# Patient Record
Sex: Male | Born: 1980 | Race: Black or African American | Hispanic: No | Marital: Single | Smoking: Current every day smoker
Health system: Southern US, Community
[De-identification: ages and names within clinical notes are randomized; demographics above are authoritative.]

## PROBLEM LIST (undated history)

## (undated) ENCOUNTER — Emergency Department: Admission: EM | Discharge status: Absent without leave | Payer: Medicaid Other | Source: Home / Self Care

## (undated) DIAGNOSIS — K219 Gastro-esophageal reflux disease without esophagitis: Secondary | ICD-10-CM

---
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
## 2017-10-11 ENCOUNTER — Encounter: Payer: Self-pay | Admitting: Emergency Medicine

## 2017-10-11 ENCOUNTER — Emergency Department

## 2017-10-11 ENCOUNTER — Emergency Department
Admission: EM | Admit: 2017-10-11 | Discharge: 2017-10-11 | Attending: Emergency Medicine | Admitting: Emergency Medicine

## 2017-10-11 ENCOUNTER — Other Ambulatory Visit: Payer: Self-pay

## 2017-10-11 DIAGNOSIS — S0285XA Fracture of orbit, unspecified, initial encounter for closed fracture: Secondary | ICD-10-CM

## 2017-10-11 DIAGNOSIS — Y999 Unspecified external cause status: Secondary | ICD-10-CM | POA: Diagnosis not present

## 2017-10-11 DIAGNOSIS — S0280XA Fracture of other specified skull and facial bones, unspecified side, initial encounter for closed fracture: Secondary | ICD-10-CM | POA: Insufficient documentation

## 2017-10-11 DIAGNOSIS — S0511XA Contusion of eyeball and orbital tissues, right eye, initial encounter: Secondary | ICD-10-CM | POA: Diagnosis not present

## 2017-10-11 DIAGNOSIS — Y939 Activity, unspecified: Secondary | ICD-10-CM | POA: Insufficient documentation

## 2017-10-11 DIAGNOSIS — Y92148 Other place in prison as the place of occurrence of the external cause: Secondary | ICD-10-CM | POA: Diagnosis not present

## 2017-10-11 DIAGNOSIS — S0990XA Unspecified injury of head, initial encounter: Secondary | ICD-10-CM | POA: Diagnosis present

## 2017-10-11 DIAGNOSIS — R51 Headache: Secondary | ICD-10-CM | POA: Diagnosis not present

## 2017-10-11 DIAGNOSIS — H1131 Conjunctival hemorrhage, right eye: Secondary | ICD-10-CM | POA: Diagnosis not present

## 2017-10-11 MED ORDER — AMOXICILLIN-POT CLAVULANATE 875-125 MG PO TABS: 1.0000 | ORAL_TABLET | Freq: Two times a day (BID) | ORAL | 0 refills | Status: AC

## 2017-10-11 MED ORDER — IBUPROFEN 800 MG PO TABS
800.0000 mg | ORAL_TABLET | ORAL | Status: AC
  Administered 2017-10-11: 800 mg via ORAL
  Filled 2017-10-11: qty 1
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
## 2017-10-22 ENCOUNTER — Other Ambulatory Visit: Payer: Self-pay

## 2017-10-22 ENCOUNTER — Encounter: Payer: Self-pay | Admitting: *Deleted

## 2017-10-23 ENCOUNTER — Ambulatory Visit
Admission: RE | Admit: 2017-10-23 | Discharge: 2017-10-23 | Disposition: A | Discharge status: Home / Self Care | Payer: Self-pay | Source: Ambulatory Visit | Attending: Otolaryngology | Admitting: Otolaryngology

## 2017-10-23 ENCOUNTER — Ambulatory Visit: Payer: Self-pay | Admitting: Anesthesiology

## 2017-10-23 ENCOUNTER — Encounter
Admission: RE | Disposition: A | Discharge status: Home / Self Care | Payer: Self-pay | Source: Ambulatory Visit | Attending: Otolaryngology

## 2017-10-23 DIAGNOSIS — S0231XA Fracture of orbital floor, right side, initial encounter for closed fracture: Secondary | ICD-10-CM | POA: Insufficient documentation

## 2017-10-23 DIAGNOSIS — F17200 Nicotine dependence, unspecified, uncomplicated: Secondary | ICD-10-CM | POA: Insufficient documentation

## 2017-10-23 DIAGNOSIS — Z833 Family history of diabetes mellitus: Secondary | ICD-10-CM | POA: Insufficient documentation

## 2017-10-23 DIAGNOSIS — X58XXXA Exposure to other specified factors, initial encounter: Secondary | ICD-10-CM | POA: Insufficient documentation

## 2017-10-23 DIAGNOSIS — Y939 Activity, unspecified: Secondary | ICD-10-CM | POA: Insufficient documentation

## 2017-10-23 DIAGNOSIS — K219 Gastro-esophageal reflux disease without esophagitis: Secondary | ICD-10-CM | POA: Insufficient documentation

## 2017-10-23 DIAGNOSIS — S0571XA Avulsion of right eye, initial encounter: Secondary | ICD-10-CM | POA: Insufficient documentation

## 2017-10-23 HISTORY — DX: Gastro-esophageal reflux disease without esophagitis: K21.9

## 2017-10-23 SURGERY — NASAL VALVE REPAIR WITH NASAL IMPLANT
Anesthesia: General | Site: Eye | Laterality: Right | Wound class: Clean

## 2017-10-23 MED ORDER — MIDAZOLAM HCL 5 MG/5ML IJ SOLN
INTRAMUSCULAR | Status: DC | PRN
  Administered 2017-10-23: 2 mg via INTRAVENOUS

## 2017-10-23 MED ORDER — OXYCODONE HCL 5 MG/5ML PO SOLN: 5.0000 mg | Freq: Once | ORAL | Status: DC | PRN

## 2017-10-23 MED ORDER — ACETAMINOPHEN 10 MG/ML IV SOLN: 1000.0000 mg | Freq: Once | INTRAVENOUS | Status: DC | PRN

## 2017-10-23 MED ORDER — EPHEDRINE SULFATE 50 MG/ML IJ SOLN
INTRAMUSCULAR | Status: DC | PRN
  Administered 2017-10-23: 5 mg via INTRAVENOUS
  Administered 2017-10-23: 10 mg via INTRAVENOUS
  Administered 2017-10-23: 5 mg via INTRAVENOUS
  Administered 2017-10-23: 10 mg via INTRAVENOUS

## 2017-10-23 MED ORDER — SODIUM CHLORIDE 0.9 % IV SOLN
2000.0000 mg | Freq: Once | INTRAVENOUS | Status: AC
  Administered 2017-10-23: 2000 mg via INTRAVENOUS

## 2017-10-23 MED ORDER — DEXAMETHASONE SODIUM PHOSPHATE 4 MG/ML IJ SOLN
INTRAMUSCULAR | Status: DC | PRN
  Administered 2017-10-23: 8 mg via INTRAVENOUS

## 2017-10-23 MED ORDER — OXYCODONE HCL 5 MG PO TABS: 5.0000 mg | ORAL_TABLET | Freq: Once | ORAL | Status: DC | PRN

## 2017-10-23 MED ORDER — LACTATED RINGERS IV SOLN
INTRAVENOUS | Status: DC
  Administered 2017-10-23 (×2): via INTRAVENOUS

## 2017-10-23 MED ORDER — ONDANSETRON HCL 4 MG/2ML IJ SOLN: 4.0000 mg | Freq: Once | INTRAMUSCULAR | Status: DC | PRN

## 2017-10-23 MED ORDER — FENTANYL CITRATE (PF) 100 MCG/2ML IJ SOLN
INTRAMUSCULAR | Status: DC | PRN
  Administered 2017-10-23: 100 ug via INTRAVENOUS
  Administered 2017-10-23: 25 ug via INTRAVENOUS

## 2017-10-23 MED ORDER — ONDANSETRON HCL 4 MG/2ML IJ SOLN
INTRAMUSCULAR | Status: DC | PRN
  Administered 2017-10-23 (×2): 4 mg via INTRAVENOUS

## 2017-10-23 MED ORDER — PHENYLEPHRINE HCL 0.5 % NA SOLN
NASAL | Status: DC | PRN
  Administered 2017-10-23: 2 [drp] via NASAL

## 2017-10-23 MED ORDER — SUCCINYLCHOLINE CHLORIDE 20 MG/ML IJ SOLN
INTRAMUSCULAR | Status: DC | PRN
  Administered 2017-10-23: 100 mg via INTRAVENOUS

## 2017-10-23 MED ORDER — LIDOCAINE-EPINEPHRINE (PF) 1 %-1:200000 IJ SOLN
INTRAMUSCULAR | Status: DC | PRN
  Administered 2017-10-23: 2 mL

## 2017-10-23 MED ORDER — LIDOCAINE HCL (CARDIAC) 20 MG/ML IV SOLN
INTRAVENOUS | Status: DC | PRN
  Administered 2017-10-23: 40 mg via INTRAVENOUS

## 2017-10-23 MED ORDER — BACITRACIN 500 UNIT/GM OP OINT
TOPICAL_OINTMENT | OPHTHALMIC | Status: DC | PRN
  Administered 2017-10-23: 1 via OPHTHALMIC

## 2017-10-23 MED ORDER — ACETAMINOPHEN 10 MG/ML IV SOLN
1000.0000 mg | Freq: Once | INTRAVENOUS | Status: AC
  Administered 2017-10-23: 1000 mg via INTRAVENOUS

## 2017-10-23 MED ORDER — LACTATED RINGERS IV SOLN: INTRAVENOUS | Status: DC

## 2017-10-23 MED ORDER — PROPOFOL 10 MG/ML IV BOLUS
INTRAVENOUS | Status: DC | PRN
  Administered 2017-10-23: 300 mg via INTRAVENOUS

## 2017-10-23 MED ORDER — FENTANYL CITRATE (PF) 100 MCG/2ML IJ SOLN: 25.0000 ug | INTRAMUSCULAR | Status: DC | PRN

## 2017-10-23 MED ORDER — DEXMEDETOMIDINE HCL 200 MCG/2ML IV SOLN
INTRAVENOUS | Status: DC | PRN
  Administered 2017-10-23: 12 ug via INTRAVENOUS

## 2017-10-23 SURGICAL SUPPLY — 39 items
ADHESIVE MASTISOL STRL (MISCELLANEOUS) IMPLANT
APPLICATOR COTTON TIP WD 3 STR (MISCELLANEOUS) ×6 IMPLANT
BLADE SURG 15 STRL LF DISP TIS (BLADE) IMPLANT
BLADE SURG 15 STRL SS (BLADE)
CANISTER SUCT 1200ML W/VALVE (MISCELLANEOUS) ×3 IMPLANT
CLOSURE WOUND 1/2 X4 (GAUZE/BANDAGES/DRESSINGS) ×1
CORD BIP STRL DISP 12FT (MISCELLANEOUS) ×3 IMPLANT
DRSG TELFA 3X8 NADH (GAUZE/BANDAGES/DRESSINGS) IMPLANT
ELECT CAUTERY NEEDLE 2.0 MIC (NEEDLE) ×3 IMPLANT
ELECT REM PT RETURN 9FT ADLT (ELECTROSURGICAL) ×3
ELECTRODE REM PT RTRN 9FT ADLT (ELECTROSURGICAL) ×1 IMPLANT
GLOVE SURG SS PI 7.5 STRL IVOR (GLOVE) ×9 IMPLANT
GOWN STRL REUS W/ TWL LRG LVL3 (GOWN DISPOSABLE) ×1 IMPLANT
GOWN STRL REUS W/TWL LRG LVL3 (GOWN DISPOSABLE) ×2
KIT TURNOVER KIT A (KITS) ×3 IMPLANT
MARKER SKIN DUAL TIP RULER LAB (MISCELLANEOUS) ×3 IMPLANT
NS IRRIG 500ML POUR BTL (IV SOLUTION) ×3 IMPLANT
PACK HEAD/NECK (MISCELLANEOUS) ×3 IMPLANT
PATTIES SURGICAL .5 X.5 (GAUZE/BANDAGES/DRESSINGS) ×3 IMPLANT
PATTIES SURGICAL .5 X3 (DISPOSABLE) ×3 IMPLANT
PENCIL SMOKE EVACUATOR (MISCELLANEOUS) ×3 IMPLANT
SOL BAL SALT 15ML (MISCELLANEOUS) ×3
SOLUTION BAL SALT 15ML (MISCELLANEOUS) ×1 IMPLANT
SPONGE KITTNER 5P (MISCELLANEOUS) ×3 IMPLANT
SPONGE XRAY 4X4 16PLY STRL (MISCELLANEOUS) ×3 IMPLANT
STRIP CLOSURE SKIN 1/2X4 (GAUZE/BANDAGES/DRESSINGS) ×2 IMPLANT
SUCTION FRAZIER HANDLE 10FR (MISCELLANEOUS) ×2
SUCTION FRAZIER TIP 10 FR DISP (SUCTIONS) ×3 IMPLANT
SUCTION TUBE FRAZIER 10FR DISP (MISCELLANEOUS) ×1 IMPLANT
SUT CHROMIC 5-0 (SUTURE)
SUT CHROMIC 5-0 P2 18XMFL CR (SUTURE)
SUT PLAIN GUT (SUTURE) ×3 IMPLANT
SUT SILK 3 0 SH 30 (SUTURE) IMPLANT
SUT VIC AB 4-0 RB1 27 (SUTURE)
SUT VIC AB 4-0 RB1 27X BRD (SUTURE) IMPLANT
SUT VIC AB 5-0 P-3 18X BRD (SUTURE) ×1 IMPLANT
SUT VIC AB 5-0 P3 18 (SUTURE) ×2
SUTURE CHRMC 5-0 P2 18XMF CR (SUTURE) IMPLANT
silastic sheet 0.040 ×3 IMPLANT

## 2017-10-23 NOTE — Anesthesia Preprocedure Evaluation (Signed)
Anesthesia Evaluation  Patient identified by MRN, date of birth, ID band Patient awake    Reviewed: Allergy & Precautions, NPO status , Patient's Chart, lab work & pertinent test results  History of Anesthesia Complications Negative for: history of anesthetic complications  Airway Mallampati: II  TM Distance: >3 FB Neck ROM: Full    Dental no notable dental hx.    Pulmonary Current Smoker (1 ppd),    Pulmonary exam normal breath sounds clear to auscultation       Cardiovascular Exercise Tolerance: Good negative cardio ROS Normal cardiovascular exam Rhythm:Regular Rate:Normal     Neuro/Psych negative neurological ROS     GI/Hepatic GERD  Controlled,  Endo/Other  negative endocrine ROS  Renal/GU negative Renal ROS     Musculoskeletal   Abdominal   Peds  Hematology negative hematology ROS (+)   Anesthesia Other Findings Left orbital fracture  Reproductive/Obstetrics                             Anesthesia Physical Anesthesia Plan  ASA: II  Anesthesia Plan: General   Post-op Pain Management:    Induction: Intravenous  PONV Risk Score and Plan: 1 and Ondansetron  Airway Management Planned: Oral ETT  Additional Equipment:   Intra-op Plan:   Post-operative Plan: Extubation in OR  Informed Consent: I have reviewed the patients History and Physical, chart, labs and discussed the procedure including the risks, benefits and alternatives for the proposed anesthesia with the patient or authorized representative who has indicated his/her understanding and acceptance.     Plan Discussed with: CRNA  Anesthesia Plan Comments:         Anesthesia Quick Evaluation

## 2017-10-23 NOTE — Transfer of Care (Signed)
Immediate Anesthesia Transfer of Care Note  Patient: Melvin Scott  Procedure(s) Performed: ORBITAL BLOWOUT FRACTURE WITH IMPLANT (Right Eye)  Patient Location: PACU  Anesthesia Type: General  Level of Consciousness: awake, alert  and patient cooperative  Airway and Oxygen Therapy: Patient Spontanous Breathing and Patient connected to supplemental oxygen  Post-op Assessment: Post-op Vital signs reviewed, Patient's Cardiovascular Status Stable, Respiratory Function Stable, Patent Airway and No signs of Nausea or vomiting  Post-op Vital Signs: Reviewed and stable  Complications: No apparent anesthesia complications

## 2017-10-23 NOTE — Discharge Instructions (Signed)
°  General Anesthesia, Adult, Care After These instructions provide you with information about caring for yourself after your procedure. Your health care provider may also give you more specific instructions. Your treatment has been planned according to current medical practices, but problems sometimes occur. Call your health care provider if you have any problems or questions after your procedure. What can I expect after the procedure? After the procedure, it is common to have:  Vomiting.  A sore throat.  Mental slowness.  It is common to feel:  Nauseous.  Cold or shivery.  Sleepy.  Tired.  Sore or achy, even in parts of your body where you did not have surgery.  Follow these instructions at home: For at least 24 hours after the procedure:  Do not: ? Participate in activities where you could fall or become injured. ? Drive. ? Use heavy machinery. ? Drink alcohol. ? Take sleeping pills or medicines that cause drowsiness. ? Make important decisions or sign legal documents. ? Take care of children on your own.  Rest. Eating and drinking  If you vomit, drink water, juice, or soup when you can drink without vomiting.  Drink enough fluid to keep your urine clear or pale yellow.  Make sure you have little or no nausea before eating solid foods.  Follow the diet recommended by your health care provider. General instructions  Have a responsible adult stay with you until you are awake and alert.  Return to your normal activities as told by your health care provider. Ask your health care provider what activities are safe for you.  Take over-the-counter and prescription medicines only as told by your health care provider.  If you smoke, do not smoke without supervision.  Keep all follow-up visits as told by your health care provider. This is important. Contact a health care provider if:  You continue to have nausea or vomiting at home, and medicines are not  helpful.  You cannot drink fluids or start eating again.  You cannot urinate after 8-12 hours.  You develop a skin rash.  You have fever.  You have increasing redness at the site of your procedure. Get help right away if:  You have difficulty breathing.  You have chest pain.  You have unexpected bleeding.  You feel that you are having a life-threatening or urgent problem.   IMPORTANT: 1. May shower in 72 hours 2. Do not rub the eye 3. Wear the patch at night to sleep 4. Keep head elevated tonight to sleep 5. Apply ointment once a day until follow-up   This information is not intended to replace advice given to you by your health care provider. Make sure you discuss any questions you have with your health care provider. Document Released: 10/14/2000 Document Revised: 12/11/2015 Document Reviewed: 06/22/2015 Elsevier Interactive Patient Education  [employer]2018 [employer]

## 2017-10-23 NOTE — Anesthesia Postprocedure Evaluation (Signed)
Anesthesia Post Note  Patient: Melvin Scott  Procedure(s) Performed: ORBITAL BLOWOUT FRACTURE WITH IMPLANT (Right Eye)  Patient location during evaluation: PACU Anesthesia Type: General Level of consciousness: awake and alert, oriented and patient cooperative Pain management: pain level controlled Vital Signs Assessment: post-procedure vital signs reviewed and stable Respiratory status: spontaneous breathing, nonlabored ventilation and respiratory function stable Cardiovascular status: blood pressure returned to baseline and stable Postop Assessment: adequate PO intake Anesthetic complications: no    Reed BreechAndrea Zela Sobieski

## 2017-10-23 NOTE — Anesthesia Procedure Notes (Signed)
Procedure Name: Intubation Date/Time: 10/23/2017 2:48 PM Performed by: Janna Arch, CRNA Pre-anesthesia Checklist: Patient identified, Emergency Drugs available, Suction available and Patient being monitored Patient Re-evaluated:Patient Re-evaluated prior to induction Oxygen Delivery Method: Circle system utilized Preoxygenation: Pre-oxygenation with 100% oxygen Induction Type: IV induction Ventilation: Mask ventilation without difficulty Laryngoscope Size: Mac and 3 Grade View: Grade I Tube type: Oral Rae Tube size: 7.5 mm Number of attempts: 1 Airway Equipment and Method: Stylet Placement Confirmation: ETT inserted through vocal cords under direct vision,  positive ETCO2 and breath sounds checked- equal and bilateral Secured at: 21 cm Tube secured with: Tape

## 2017-10-23 NOTE — H&P (Signed)
H&P has been reviewed, and pt reevaluated, and no changes necessary. To be downloaded later.

## 2017-10-23 NOTE — Op Note (Signed)
10/23/2017  4:54 PM    Melvin Scott  [id number]   Pre-Op Dx: Large right orbital blowout fracture with displacement of the eye  Post-op Dx: Same  Proc: Open reduction and repair of right orbital blowout fracture with Silastic implant  Surg:  Melvin Sessions Charlissa Petros  Anes:  GOT  EBL: 50 mL  Comp: None  Findings: Very large defect of the orbital floor that was down approximately 6-7 mm.  The orbital fracture was approximately 2.5 cm in width and about 3 cm in depth    Procedure: The patient was given general anesthesia by oral endotracheal intubation.  His right lower lid was marked for a subciliary incision to extend about two thirds the way across the orbit from the lateral canthal area.  It extended laterally in a crow's foot approximately 1 cm.  The skin was infiltrated with 2 mL of 1% Xylocaine with epi 1-100,000.  He was then prepped with Betadine and a sterile drape was applied.  Lacri-Lube was placed in the eye followed by a corneal shield.  The skin incision was created in the lateral crow's foot and extended along the subciliary line with a scalpel.  This went about a third the way across.  The skin and muscle layer was opened and dissection was then carried bluntly underneath the skin and muscle along the lower lid until two thirds of it was exposed.  The skin and orbicularis muscle was then incised to open up the lower lid.  Needlepoint cautery was used for controlling bleeding of the cut muscle edge.  The skin muscle flap was elevated over the orbital septum down to the inferior orbital rim.  Once the rim was clearly identified all the way across a sharp incision was made on the anterior face of the rim through the periosteum.  A freer elevator was used to free up the periosteum from over the rim of the infraorbital bone and then elevate the periosteum over the floor of the orbit a malleable retractor was used to help hold this forward to aid in the dissection.  The free air elevator  was used to lifted up and the inferior periosteum was pretty intact.  About 6-8 mm behind the orbital rim a the blowout fracture was encountered.  I was able to elevate the periosteum and fat off of the fracture piece along its anterior border and then follow along the lateral borders medially and laterally.  The width of the fracture was approximately 2-1/2 cm and extended medially to the medial wall of the orbit.  At the medial side there was a separation of about 7 mm where the fat was pulled out of this that had pushed down into the sinus.  Laterally there is only about 4 mm of displacement as it seemed to be more hinged laterally.  I tracked this back to the posterior border that was lying approximately 2 cm from the anterior fracture line.  I freed this up as much as I could and pulled up some of the fat that had fallen into the posterior fracture area.  I was able to clearly see the medial anterior and lateral walls of the fracture line.  Got to the posterior fracture line but did want to dissect further beyond that prevent doing damage to the optic nerve.  Some cottonoid pledgets with Neo-Synephrine were placed temporarily to help with some vasoconstriction in controlling bleeding while a Silastic implant was fashioned.  I used a piece of thin Silastic that was  0.040 inches thick.  This seemed to be thick enough to be able to bridge this size hole and not collapsed down into it.  I measured the hold to be over 2 cm in width and about 2 cm in depth.  I fashioned by Silastic to be almost 3 cm in width and about 2-1/2 cm in depth.  I then removed the cottonoid pledgets and had good visualization of the entire fracture area.  I placed the Silastic and it seemed to fit well side to side but was coming out on the anterior border to the edge of the orbital rim.  I removed it and trimmed about 4 mm off of the anterior border to make sure that it was sitting behind the orbital rim.  I then placed it again and it  was sitting in a good position and supporting the floor of the orbits and bridging the gap above the bone fragment that was displaced.  Once this was in position it seems like it was a very solidly lodged in and not mobile.  The wound was flushed several times above and below the graft to make sure that the area was clean.  I then used 5-0 Vicryls to suture the periosteum back together over the orbital rim.  This was done with several interrupted sutures.  The lower lid skin muscle flap was then draped back into its anatomic position.  The lateral muscle was then anchored down to the periosteum of the lateral orbit near the lateral canthal area.  This anchored the lower lid in position.  Some subcu sutures were placed laterally as well but none were placed along the lower lid margin.  5-0 fast-absorbing chromic was then placed in interrupted sutures along the inferior lid margin to help hold the skin edges in apposition.  The wound was then covered with bacitracin ophthalmic ointment.  The patient was awakened and taken to the recovery room in satisfactory condition.  There were no operative complications.  Dispo: To PACU to be discharged home.  Plan: To follow-up in the office in 5 days.  He will elevate his head tonight to help prevent swelling.  Will use a eye patch over the eye taped loosely on to prevent any inadvertent injury to his eye.  He can shower in 3 days.  He will let me know if there is any signs of infection or challenges with his vision.  Melvin Scott  10/23/2017 4:54 PM

## 2019-07-17 ENCOUNTER — Emergency Department: Admission: EM | Admit: 2019-07-17 | Discharge: 2019-07-17 | Discharge status: Against Medical Advice | Payer: Self-pay

## 2019-07-17 ENCOUNTER — Other Ambulatory Visit: Payer: Self-pay

## 2019-07-17 NOTE — ED Triage Notes (Signed)
Called for triage, not in [place].

## 2019-07-17 NOTE — ED Notes (Signed)
Pt not observed in lobby or outside

## 2019-07-17 NOTE — ED Triage Notes (Signed)
Called for triage, not in [facility].  

## 2020-08-01 ENCOUNTER — Emergency Department: Payer: Medicaid Other

## 2020-08-01 ENCOUNTER — Emergency Department
Admission: EM | Admit: 2020-08-01 | Discharge: 2020-08-01 | Disposition: A | Discharge status: Home / Self Care | Payer: Medicaid Other | Attending: Emergency Medicine | Admitting: Emergency Medicine

## 2020-08-01 ENCOUNTER — Other Ambulatory Visit: Payer: Self-pay

## 2020-08-01 DIAGNOSIS — Y9372 Activity, wrestling: Secondary | ICD-10-CM | POA: Insufficient documentation

## 2020-08-01 DIAGNOSIS — S8992XA Unspecified injury of left lower leg, initial encounter: Secondary | ICD-10-CM

## 2020-08-01 DIAGNOSIS — X501XXA Overexertion from prolonged static or awkward postures, initial encounter: Secondary | ICD-10-CM | POA: Insufficient documentation

## 2020-08-01 DIAGNOSIS — F17210 Nicotine dependence, cigarettes, uncomplicated: Secondary | ICD-10-CM | POA: Insufficient documentation

## 2020-08-01 DIAGNOSIS — Z9889 Other specified postprocedural states: Secondary | ICD-10-CM | POA: Insufficient documentation

## 2020-08-01 DIAGNOSIS — S82142A Displaced bicondylar fracture of left tibia, initial encounter for closed fracture: Secondary | ICD-10-CM

## 2020-08-01 DIAGNOSIS — Y92009 Unspecified place in unspecified non-institutional (private) residence as the place of occurrence of the external cause: Secondary | ICD-10-CM | POA: Insufficient documentation

## 2020-08-01 MED ORDER — HYDROCODONE-ACETAMINOPHEN 5-325 MG PO TABS: 2.0000 | ORAL_TABLET | Freq: Four times a day (QID) | ORAL | 0 refills | Status: AC | PRN

## 2020-08-01 MED ORDER — HYDROCODONE-ACETAMINOPHEN 5-325 MG PO TABS
1.0000 | ORAL_TABLET | Freq: Once | ORAL | Status: AC
  Administered 2020-08-01: 1 via ORAL
  Filled 2020-08-01: qty 1

## 2020-08-01 NOTE — ED Triage Notes (Signed)
Pt to ED for chief complaint of left knee pain that started last night. States was wrestling with cousin and twisted knee.  Pt ambulatory with crutches .denies taking anything for pain

## 2020-08-01 NOTE — Discharge Instructions (Addendum)
Please use the knee immobilizer at all times except for showering.  Please remain nonweightbearing.  You have been prescribed Norco for pain relief.  Please do not use this more than once every 6 hours.  You may use Tylenol, 650 mg in addition to this as well as ibuprofen.  You may also use ice for the swelling and pain.

## 2020-08-02 NOTE — ED Provider Notes (Signed)
[facility] Emergency Department Provider Note  ____________________________________________   Event Date/Time   First MD Initiated Contact with Patient 08/01/20 1817     (approximate)  I have reviewed the triage vital signs and the nursing notes.   HISTORY  Chief Complaint Knee Pain  HPI Melvin Scott is a 40 y.o. male who presents to the emergency department for evaluation of left knee pain.  The patient states that he was wrestling with his cousin last night when his knee turned awkwardly and he had acute pain.  He has been able to bear weight on the left knee since that time, has been using crutches that he already had at home.  He rates his pain a 10/10.  He is unable to bend the knee, unable to actively lift the leg on his own due to weakness and pain.  He denies any pain in his left ankle or hip.  He does report that he recently got off of crutches approximately 30 days ago due to surgery on Achilles tendon and calcaneal fracture that were performed at [facility].  He reports the surgery was December of 2020 and was on crutches for approximately 11 months.  He does report remote history of injury to the left knee as well, but states that this was when he was in middle school.         Past Medical History:  Diagnosis Date  . GERD (gastroesophageal reflux disease)     There are no problems to display for this patient.   Past Surgical History:  Procedure Laterality Date  . CYST EXCISION Right 2000   groin    Prior to Admission medications   Medication Sig Start Date End Date Taking? Authorizing Provider  HYDROcodone-acetaminophen (NORCO) 5-325 MG tablet Take 2 tablets by mouth every 6 (six) hours as needed for up to 5 days for moderate pain. 08/01/20 08/06/20 Yes Ediel Unangst, Ruben Gottron, PA  amoxicillin-clavulanate (AUGMENTIN) 875-125 MG tablet Take 1 tablet by mouth 2 (two) times daily. 10/11/17   Sharyn Creamer, MD    Allergies Patient has no known  allergies.  No family history on file.  Social History Social History   Tobacco Use  . Smoking status: Current Every Day Smoker    Packs/day: 1.00    Years: 2.00    Pack years: 2.00    Types: Cigarettes  . Smokeless tobacco: Never Used  Vaping Use  . Vaping Use: Never used  Substance Use Topics  . Alcohol use: Yes    Alcohol/week: 2.0 standard drinks    Types: 2 Shots of liquor per week    Review of Systems Constitutional: No fever/chills Eyes: No visual changes. ENT: No sore throat. Cardiovascular: Denies chest pain. Respiratory: Denies shortness of breath. Gastrointestinal: No abdominal pain.  No nausea, no vomiting.  No diarrhea.  No constipation. Genitourinary: Negative for dysuria. Musculoskeletal: + Left knee pain, negative for back pain. Skin: Negative for rash. Neurological: Negative for headaches, focal weakness or numbness.  ____________________________________________   PHYSICAL EXAM:  VITAL SIGNS: ED Triage Vitals [08/01/20 1746]  Enc Vitals Group     BP 137/78     Pulse Rate 76     Resp 18     Temp 97.8 F (36.6 C)     Temp Source Oral     SpO2 96 %     Weight 205 lb (93 kg)     Height 6' (1.829 m)     Head Circumference  Peak Flow      Pain Score 10     Pain Loc      Pain Edu?      Excl. in GC?    Constitutional: Alert and oriented. Well appearing and in no acute distress. Eyes: Conjunctivae are normal. PERRL. EOMI. Head: Atraumatic. Nose: No congestion/rhinnorhea. Mouth/Throat: Mucous membranes are moist.  Oropharynx non-erythematous. Neck: No stridor.   Cardiovascular: Normal rate, regular rhythm. Grossly normal heart sounds.  Good peripheral circulation. Respiratory: Normal respiratory effort.  No retractions. Lungs CTAB. Gastrointestinal: Soft and nontender. No distention. No abdominal bruits. No CVA tenderness. Musculoskeletal: There is obvious large joint effusion of the left knee.  Patient does not have full extension, is  limited in range of motion from approximately 15 to 20 degrees.  Ligamentous exam unable to be tolerated by patient.  He is diffusely tender about the knee, most tender on the posterior and lateral joint line.  Is unable to actively perform a straight leg raise secondary to weakness of the leg.  He does have full active range of motion of the left ankle without difficulty.  Dorsal pedal pulse 2+, posterior tibial pulse 2+. Neurologic:  Normal speech and language. No gross focal neurologic deficits are appreciated. No gait instability. Skin:  Skin is warm, dry and intact. No rash noted. Psychiatric: Mood and affect are normal. Speech and behavior are normal.  _____________________________________________  RADIOLOGY I, Lucy Chris, personally viewed and evaluated these images (plain radiographs) as part of my medical decision making, as well as reviewing the written report by the radiologist.  ED provider interpretation: X-rays of the left knee demonstrate irregularity to tibial surface, see radiology report for CT findings.  Official radiology report(s): CT Knee Left Wo Contrast  Result Date: 08/01/2020 CLINICAL DATA:  Left knee pain after injury. Twisting injury. Question fracture on radiograph. EXAM: CT OF THE left KNEE WITHOUT CONTRAST TECHNIQUE: Multidetector CT imaging of the left knee was performed according to the standard protocol. Multiplanar CT image reconstructions were also generated. COMPARISON:  Radiograph earlier today. FINDINGS: Bones/Joint/Cartilage Acute fracture of the posterolateral tibial plateau, series 5, images 103-107. Fracture primarily involves the posterior tibia with 4 mm articular depression. Fracture extends centrally to the tibial spine. There is no convincing involvement of the medial tibial plateau, lucency on radiograph appears artifactual. There is a well corticated lucency adjacent the tibial spine likely represent sequela of remote injury. Background  osteopenia/osteoporosis, particularly advanced for age. No additional fracture. There is a large lipohemarthrosis. Ligaments Suboptimally assessed by CT. ACL, PCL, and MCL fibers are visualized. Muscles and Tendons Intact quadriceps and patellar tendons. No evidence of intramuscular hematoma. Soft tissues Generalized soft tissue edema. IMPRESSION: 1. Acute fracture of the posterolateral tibial plateau with 4 mm articular depression. Fracture extends centrally to the tibial spine. 2. Large lipohemarthrosis. 3. Background osteopenia/osteoporosis, particularly advanced for age. Electronically Signed   By: Narda Rutherford M.D.   On: 08/01/2020 19:21   DG Knee Complete 4 Views Left  Result Date: 08/01/2020 CLINICAL DATA:  Left knee pain after injury. Remote patellar fracture. EXAM: LEFT KNEE - COMPLETE 4+ VIEW COMPARISON:  None. FINDINGS: Bony under mineralization limits detailed assessment. There is slight cortical irregularity about the tibial spine and possibly medial tibial plateau. Moderate suprapatellar joint effusion. Alignment is maintained with preserved joint spaces. Remote fibular shaft fracture is partially included in the field of view. IMPRESSION: 1. Mild cortical irregularity about the tibial spine and possibly medial tibial plateau may represent subtle  nondisplaced fracture. Moderate suprapatellar joint effusion. Consider further evaluation with CT or MRI. 2. Bony under mineralization. Electronically Signed   By: Narda Rutherford M.D.   On: 08/01/2020 18:22   ____________________________________________   INITIAL IMPRESSION / ASSESSMENT AND PLAN / ED COURSE  As part of my medical decision making, I reviewed the following data within the electronic MEDICAL RECORD NUMBER Nursing notes reviewed and incorporated, Radiograph reviewed  and Notes from prior ED visits        Patient is a 40 year old male who presents to the emergency department for evaluation of left knee injury that occurred last  night.  See HPI for further details.  On physical exam, patient has significant obvious joint effusion, significantly limited range of motion and inability to actively perform a straight leg raise given weakness in the leg.  X-ray was obtained and reveals a irregularity to the tibial surface.  CT imaging was performed and reveals a 4 mm depressed tibial fracture.  Dr. Joice Lofts was consulted on the case, who recommends placing in a straight leg immobilizer and keeping patient nonweightbearing for follow-up in their office.  Patient is amenable with this plan.  He will be given a short course of Norco for pain control and also encouraged to use Tylenol, anti-inflammatories and ice in addition to this.  He was counseled on the risk and benefits of opioid pain medications.  Patient was also counseled on signs and symptoms of compartment syndrome and will return should he experience any of the symptoms.  Patient will also return for any other acute worsening.      ____________________________________________   FINAL CLINICAL IMPRESSION(S) / ED DIAGNOSES  Final diagnoses:  Injury of left knee, initial encounter  Closed fracture of left tibial plateau, initial encounter     ED Discharge Orders         Ordered    HYDROcodone-acetaminophen (NORCO) 5-325 MG tablet  Every 6 hours PRN        08/01/20 1956          *Please note:  Melvin Scott was evaluated in Emergency Department on 08/02/2020 for the symptoms described in the history of present illness. He was evaluated in the context of the global COVID-19 pandemic, which necessitated consideration that the patient might be at risk for infection with the SARS-CoV-2 virus that causes COVID-19. Institutional protocols and algorithms that pertain to the evaluation of patients at risk for COVID-19 are in a state of rapid change based on information released by regulatory bodies including the CDC and federal and state organizations. These policies and  algorithms were followed during the patient's care in the ED.  Some ED evaluations and interventions may be delayed as a result of limited staffing during and the pandemic.*   Note:  This document was prepared using Dragon voice recognition software and may include unintentional dictation errors.    Lucy Chris, PA 08/02/20 1512    Concha Se, MD 08/05/20 [phone]

## 2021-09-14 ENCOUNTER — Telehealth: Payer: Self-pay | Admitting: Emergency Medicine

## 2021-09-14 ENCOUNTER — Encounter: Payer: Self-pay | Admitting: Emergency Medicine

## 2021-09-14 ENCOUNTER — Emergency Department: Payer: Medicaid Other

## 2021-09-14 ENCOUNTER — Emergency Department
Admission: EM | Admit: 2021-09-14 | Discharge: 2021-09-14 | Disposition: A | Discharge status: Other Acute Inpatient Hospital | Payer: Medicaid Other | Attending: Emergency Medicine | Admitting: Emergency Medicine

## 2021-09-14 ENCOUNTER — Other Ambulatory Visit: Payer: Self-pay

## 2021-09-14 DIAGNOSIS — S2231XA Fracture of one rib, right side, initial encounter for closed fracture: Secondary | ICD-10-CM | POA: Insufficient documentation

## 2021-09-14 DIAGNOSIS — Z20822 Contact with and (suspected) exposure to covid-19: Secondary | ICD-10-CM | POA: Insufficient documentation

## 2021-09-14 DIAGNOSIS — S21111A Laceration without foreign body of right front wall of thorax without penetration into thoracic cavity, initial encounter: Secondary | ICD-10-CM

## 2021-09-14 DIAGNOSIS — F10929 Alcohol use, unspecified with intoxication, unspecified: Secondary | ICD-10-CM | POA: Insufficient documentation

## 2021-09-14 DIAGNOSIS — R000 Tachycardia, unspecified: Secondary | ICD-10-CM | POA: Insufficient documentation

## 2021-09-14 DIAGNOSIS — F10920 Alcohol use, unspecified with intoxication, uncomplicated: Secondary | ICD-10-CM

## 2021-09-14 DIAGNOSIS — J939 Pneumothorax, unspecified: Secondary | ICD-10-CM | POA: Insufficient documentation

## 2021-09-14 DIAGNOSIS — Z23 Encounter for immunization: Secondary | ICD-10-CM | POA: Insufficient documentation

## 2021-09-14 DIAGNOSIS — E876 Hypokalemia: Secondary | ICD-10-CM | POA: Insufficient documentation

## 2021-09-14 DIAGNOSIS — S25419A Minor laceration of unspecified pulmonary blood vessels, initial encounter: Secondary | ICD-10-CM | POA: Insufficient documentation

## 2021-09-14 DIAGNOSIS — S27339A Laceration of lung, unspecified, initial encounter: Secondary | ICD-10-CM

## 2021-09-14 DIAGNOSIS — J942 Hemothorax: Secondary | ICD-10-CM | POA: Insufficient documentation

## 2021-09-14 LAB — COMPREHENSIVE METABOLIC PANEL
ALT: 42 U/L (ref 0–44)
AST: 43 U/L — ABNORMAL HIGH (ref 15–41)
Albumin: 4.3 g/dL (ref 3.5–5.0)
Alkaline Phosphatase: 54 U/L (ref 38–126)
Anion gap: 18 — ABNORMAL HIGH (ref 5–15)
BUN: 13 mg/dL (ref 6–20)
CO2: 19 mmol/L — ABNORMAL LOW (ref 22–32)
Calcium: 8.9 mg/dL (ref 8.9–10.3)
Chloride: 104 mmol/L (ref 98–111)
Creatinine, Ser: 1.6 mg/dL — ABNORMAL HIGH (ref 0.61–1.24)
GFR, Estimated: 55 mL/min — ABNORMAL LOW (ref >60)
Glucose, Bld: 179 mg/dL — ABNORMAL HIGH (ref 70–99)
Potassium: 2.7 mmol/L — CL (ref 3.5–5.1)
Sodium: 141 mmol/L (ref 135–145)
Total Bilirubin: 0.6 mg/dL (ref 0.3–1.2)
Total Protein: 7.4 g/dL (ref 6.5–8.1)

## 2021-09-14 LAB — CBC WITH DIFFERENTIAL/PLATELET
Abs Immature Granulocytes: 0.05 10*3/uL (ref 0.00–0.07)
Basophils Absolute: 0.1 10*3/uL (ref 0.0–0.1)
Basophils Relative: 1 %
Eosinophils Absolute: 0.1 10*3/uL (ref 0.0–0.5)
Eosinophils Relative: 1 %
HCT: 37.9 % — ABNORMAL LOW (ref 39.0–52.0)
Hemoglobin: 12.3 g/dL — ABNORMAL LOW (ref 13.0–17.0)
Immature Granulocytes: 1 %
Lymphocytes Relative: 37 %
Lymphs Abs: 3.3 10*3/uL (ref 0.7–4.0)
MCH: 23.7 pg — ABNORMAL LOW (ref 26.0–34.0)
MCHC: 32.5 g/dL (ref 30.0–36.0)
MCV: 73.2 fL — ABNORMAL LOW (ref 80.0–100.0)
Monocytes Absolute: 1.1 10*3/uL — ABNORMAL HIGH (ref 0.1–1.0)
Monocytes Relative: 12 %
Neutro Abs: 4.4 10*3/uL (ref 1.7–7.7)
Neutrophils Relative %: 48 %
Platelets: 307 10*3/uL (ref 150–400)
RBC: 5.18 MIL/uL (ref 4.22–5.81)
RDW: 15.2 % (ref 11.5–15.5)
WBC: 8.9 10*3/uL (ref 4.0–10.5)
nRBC: 0 % (ref 0.0–0.2)

## 2021-09-14 LAB — MAGNESIUM: Magnesium: 2.3 mg/dL (ref 1.7–2.4)

## 2021-09-14 LAB — RESP PANEL BY RT-PCR (FLU A&B, COVID) ARPGX2
Influenza A by PCR: NEGATIVE
Influenza B by PCR: NEGATIVE
SARS Coronavirus 2 by RT PCR: NEGATIVE

## 2021-09-14 LAB — PROTIME-INR
INR: 0.9 (ref 0.8–1.2)
Prothrombin Time: 12.6 seconds (ref 11.4–15.2)

## 2021-09-14 LAB — ETHANOL: Alcohol, Ethyl (B): 211 mg/dL — ABNORMAL HIGH (ref <10)

## 2021-09-14 MED ORDER — CEFAZOLIN SODIUM-DEXTROSE 2-4 GM/100ML-% IV SOLN
2.0000 g | Freq: Once | INTRAVENOUS | Status: AC
  Administered 2021-09-14: 2 g via INTRAVENOUS
  Filled 2021-09-14: qty 100

## 2021-09-14 MED ORDER — FENTANYL CITRATE PF 50 MCG/ML IJ SOSY
PREFILLED_SYRINGE | INTRAMUSCULAR | Status: AC
  Administered 2021-09-14: 50 ug via INTRAVENOUS
  Filled 2021-09-14: qty 1

## 2021-09-14 MED ORDER — LACTATED RINGERS IV BOLUS: 1000.0000 mL | Freq: Once | INTRAVENOUS | Status: DC

## 2021-09-14 MED ORDER — ONDANSETRON HCL 4 MG/2ML IJ SOLN
INTRAMUSCULAR | Status: AC
  Administered 2021-09-14: 4 mg via INTRAVENOUS
  Filled 2021-09-14: qty 2

## 2021-09-14 MED ORDER — FENTANYL CITRATE PF 50 MCG/ML IJ SOSY
50.0000 ug | PREFILLED_SYRINGE | Freq: Once | INTRAMUSCULAR | Status: AC
  Administered 2021-09-14: 50 ug via INTRAVENOUS

## 2021-09-14 MED ORDER — TETANUS-DIPHTH-ACELL PERTUSSIS 5-2.5-18.5 LF-MCG/0.5 IM SUSY: 0.5000 mL | PREFILLED_SYRINGE | Freq: Once | INTRAMUSCULAR | Status: AC

## 2021-09-14 MED ORDER — FENTANYL CITRATE PF 50 MCG/ML IJ SOSY
50.0000 ug | PREFILLED_SYRINGE | Freq: Once | INTRAMUSCULAR | Status: AC
  Filled 2021-09-14: qty 1

## 2021-09-14 MED ORDER — SODIUM CHLORIDE 0.9% IV SOLUTION: Freq: Once | INTRAVENOUS | Status: DC

## 2021-09-14 MED ORDER — IOHEXOL 350 MG/ML SOLN
100.0000 mL | Freq: Once | INTRAVENOUS | Status: AC | PRN
  Administered 2021-09-14: 100 mL via INTRAVENOUS

## 2021-09-14 MED ORDER — TETANUS-DIPHTH-ACELL PERTUSSIS 5-2.5-18.5 LF-MCG/0.5 IM SUSY
PREFILLED_SYRINGE | INTRAMUSCULAR | Status: AC
  Administered 2021-09-14: 0.5 mL via INTRAMUSCULAR
  Filled 2021-09-14: qty 0.5

## 2021-09-14 MED ORDER — POTASSIUM CHLORIDE CRYS ER 20 MEQ PO TBCR
40.0000 meq | EXTENDED_RELEASE_TABLET | Freq: Once | ORAL | Status: DC
  Filled 2021-09-14: qty 2

## 2021-09-14 MED ORDER — ONDANSETRON HCL 4 MG/2ML IJ SOLN: 4.0000 mg | Freq: Once | INTRAMUSCULAR | Status: AC
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
## 2021-09-14 NOTE — ED Notes (Signed)
Dr. Alfred Levins aware of potassium of 2.7

## 2021-09-14 NOTE — ED Notes (Addendum)
[place] reviewed by this RN ?
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
## 2021-09-17 LAB — PREPARE RBC (CROSSMATCH)

## 2021-09-18 LAB — TYPE AND SCREEN
ABO/RH(D): A POS
Antibody Screen: NEGATIVE
Unit division: 0
Unit division: 0

## 2021-09-18 LAB — BPAM RBC
Blood Product Expiration Date: 202303282359
Blood Product Expiration Date: 202303282359
ISSUE DATE / TIME: 202302240104
ISSUE DATE / TIME: 202302241335
Unit Type and Rh: 5100
Unit Type and Rh: 5100

## 2021-11-29 IMAGING — CT CT KNEE*L* W/O CM
3 series · 15 of 33 positions shown, 18 images · non-contrast
Comparison: Radiograph earlier today.

CLINICAL DATA: Left knee pain after injury. Twisting injury.
Question fracture on radiograph.

EXAM:
CT OF THE left KNEE WITHOUT CONTRAST
TECHNIQUE: Multidetector CT imaging of the left knee was performed according to
the standard protocol. Multiplanar CT image reconstructions were
also generated.

[Series 7: axial st · axial · 0.35mm/px · z∈[+186,+361]mm · 7 of 209 slices shown, 9 images]
[im 17/209  soft-tissue]
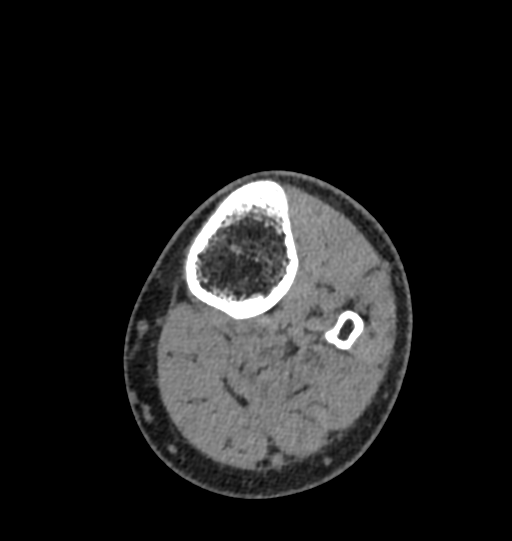
[im 17/209  bone]
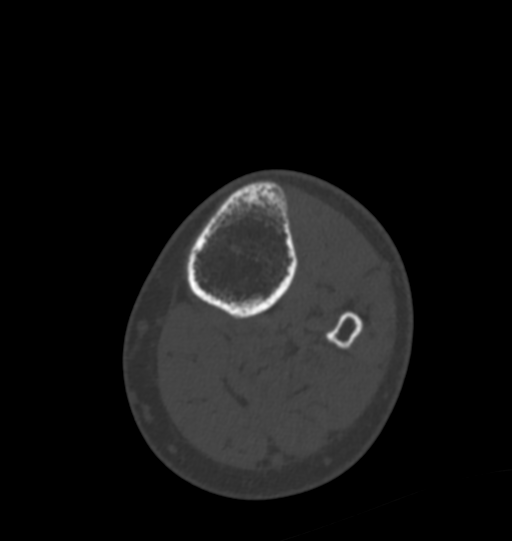
[im 49/209  bone]
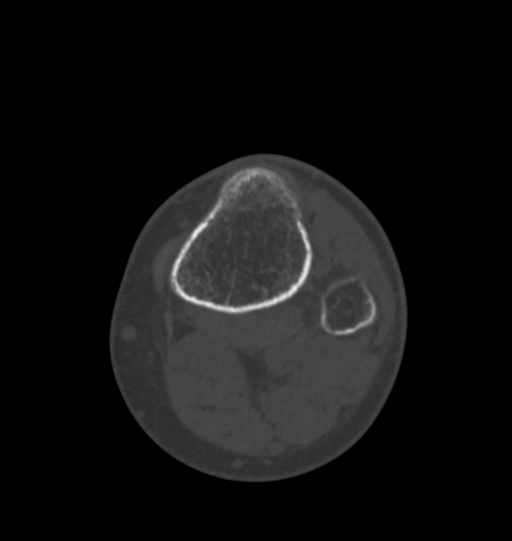
[im 81/209  bone]
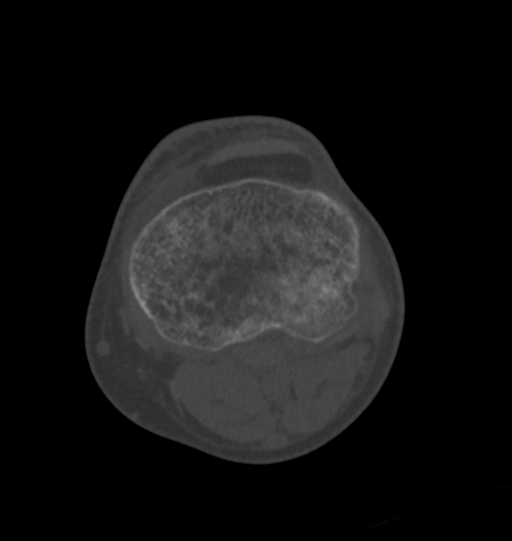
[im 113/209  bone]
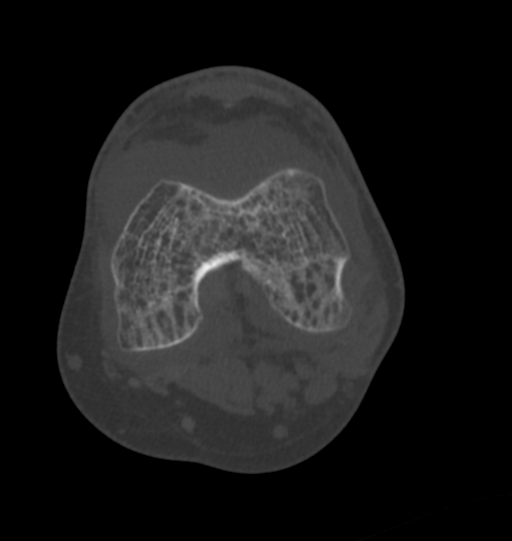
[im 129/209  soft-tissue]
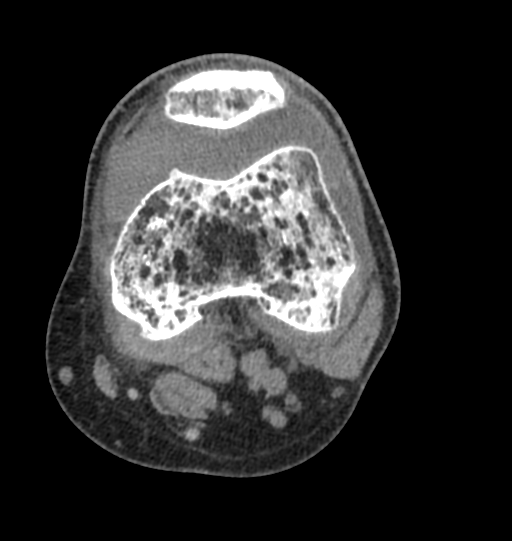
[im 129/209  bone]
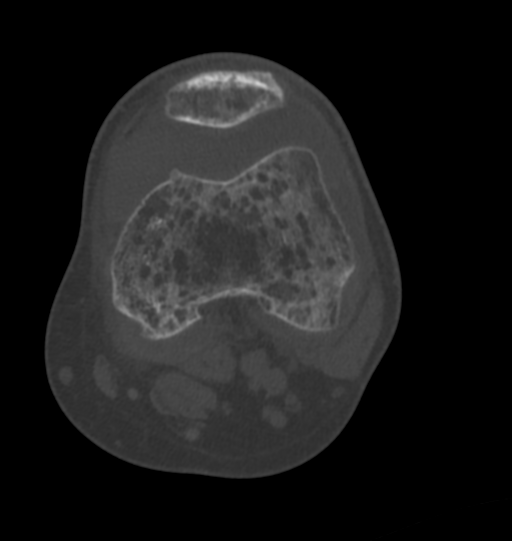
[im 161/209  bone]
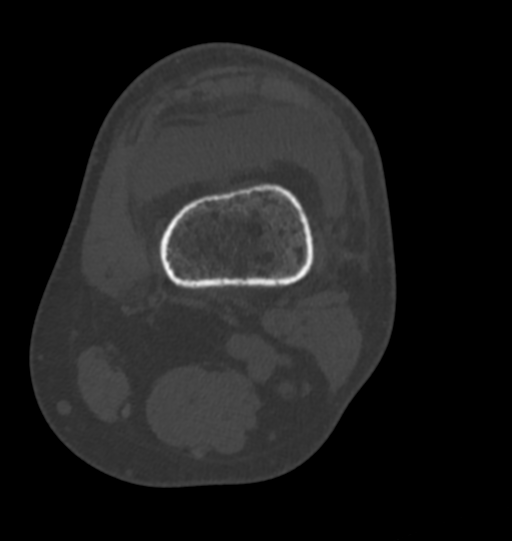
[im 193/209  bone]
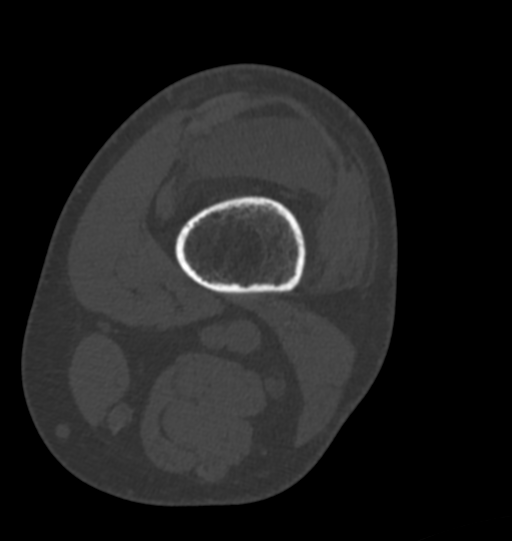

[Series 8: cor st · coronal · 0.32mm/px · 3 of 162 slices shown]
[im 33/162  bone]
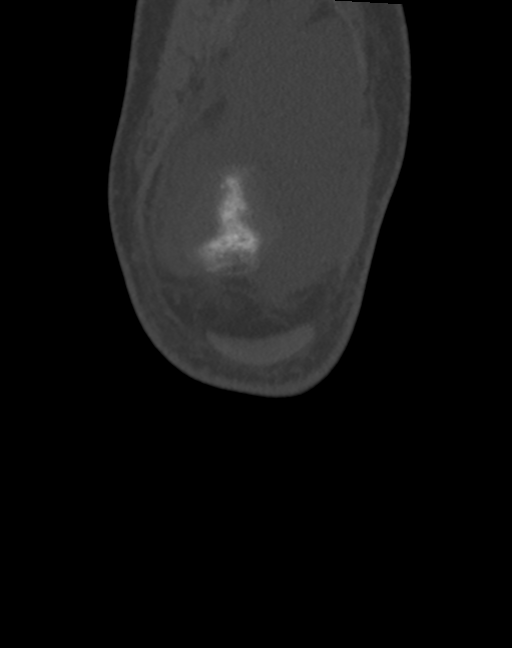
[im 65/162  bone]
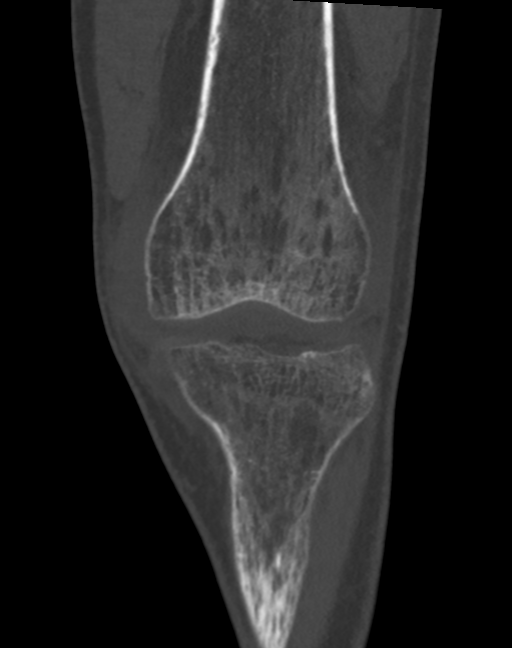
[im 97/162  bone]
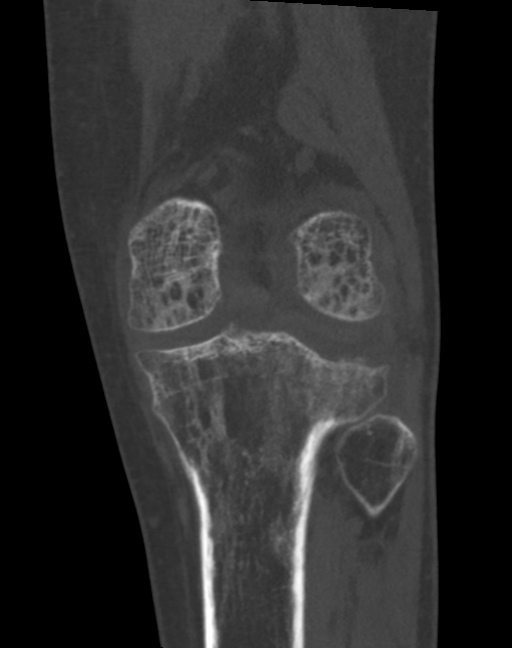

[Series 9: sag st · sagittal · 0.38mm/px · 5 of 135 slices shown, 6 images]
[im 45/135  bone]
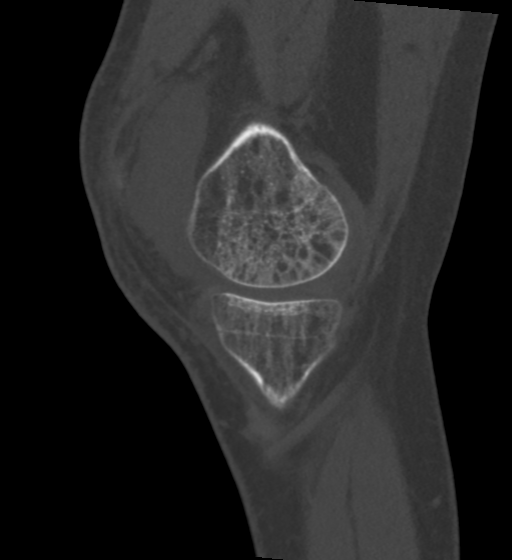
[im 56/135  bone]
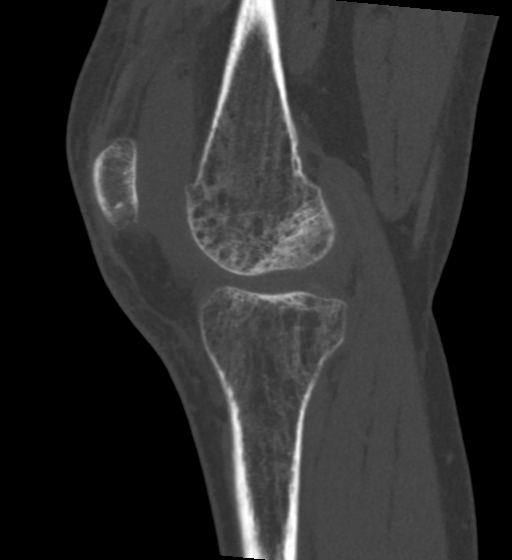
[im 68/135  soft-tissue]
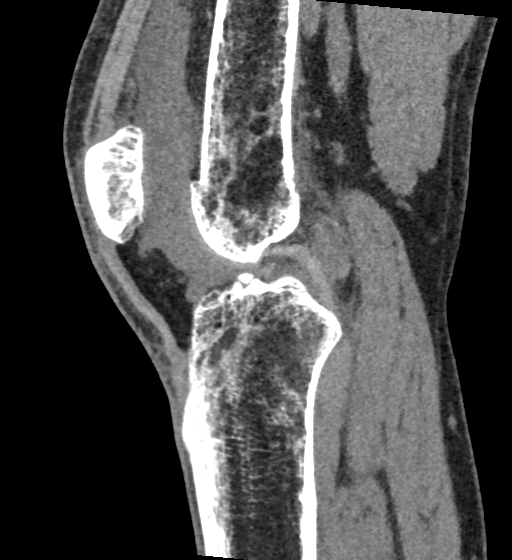
[im 68/135  bone]
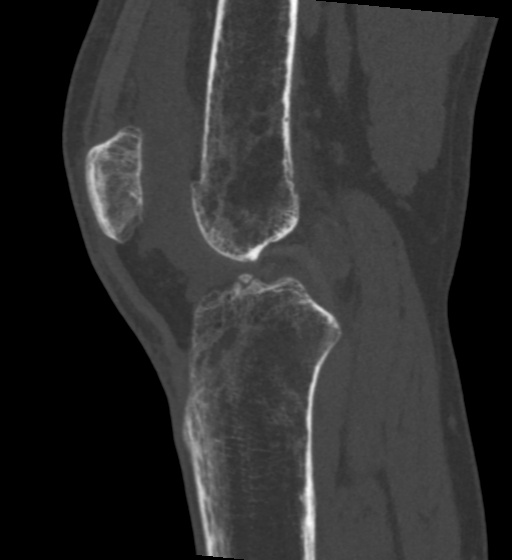
[im 79/135  bone]
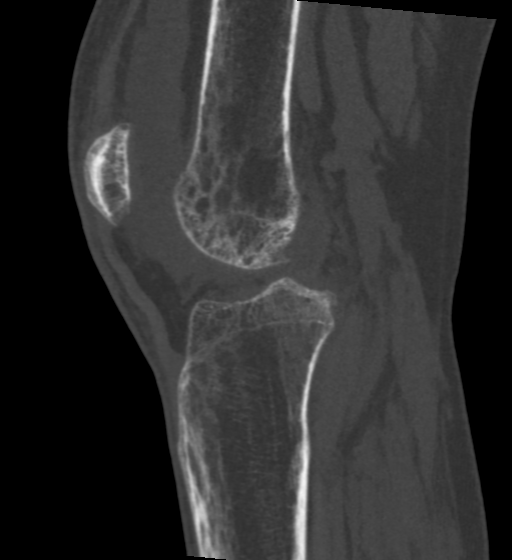
[im 90/135  bone]
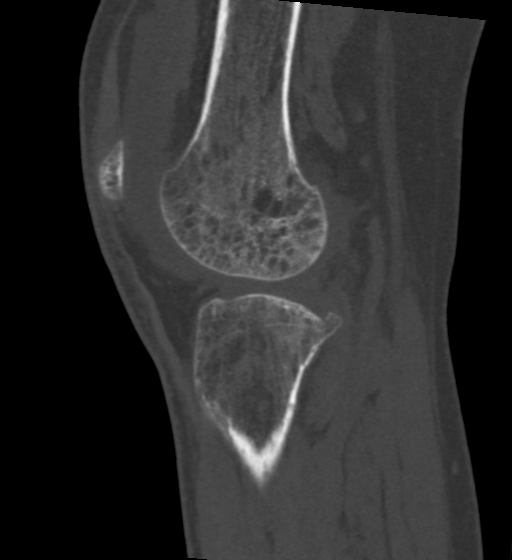

[15 of 33 positions shown; findings below may reference images not displayed]

FINDINGS: Bones/Joint/Cartilage

Acute fracture of the posterolateral tibial plateau, series 5,
images 103-107. Fracture primarily involves the posterior tibia with
4 mm articular depression. Fracture extends centrally to the tibial
spine. There is no convincing involvement of the medial tibial
plateau, lucency on radiograph appears artifactual. There is a well
corticated lucency adjacent the tibial spine likely represent
sequela of remote injury. Background osteopenia/osteoporosis,
particularly advanced for age. No additional fracture. There is a
large lipohemarthrosis.

Ligaments

Suboptimally assessed by CT. ACL, PCL, and MCL fibers are
visualized.

Muscles and Tendons

Intact quadriceps and patellar tendons. No evidence of intramuscular
hematoma.

Soft tissues

Generalized soft tissue edema.
IMPRESSION: 1. Acute fracture of the posterolateral tibial plateau with 4 mm
articular depression. Fracture extends centrally to the tibial
spine.
2. Large lipohemarthrosis.
3. Background osteopenia/osteoporosis, particularly advanced for
age.

## 2023-05-01 ENCOUNTER — Ambulatory Visit: Payer: BC Managed Care – PPO | Admitting: Pediatrics

## 2024-05-03 ENCOUNTER — Ambulatory Visit: Admitting: Internal Medicine

## 2024-05-17 ENCOUNTER — Ambulatory Visit: Admitting: Internal Medicine

## 2024-06-02 ENCOUNTER — Ambulatory Visit: Admitting: Nurse Practitioner
